# Patient Record
Sex: Male | Born: 2000 | Race: White | Hispanic: No | Marital: Single | Smoking: Never smoker
Health system: Southern US, Community
[De-identification: ages and names within clinical notes are randomized; demographics above are authoritative.]

---
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
## 2021-05-18 ENCOUNTER — Telehealth: Payer: Self-pay | Admitting: Allergy

## 2021-05-18 MED ORDER — EPINEPHRINE 0.3 MG/0.3ML IJ SOAJ: 0.3000 mg | INTRAMUSCULAR | 1 refills | Status: DC | PRN

## 2021-05-18 NOTE — Telephone Encounter (Signed)
Sent in 1 refill pt was seen in sept 2022

## 2021-05-18 NOTE — Telephone Encounter (Signed)
Patient's mom called requesting a refill for AuviQ. [facility] is getting ready to expire. Last seen 11/2020 has a follow up on 12/07/2021.

## 2021-08-28 ENCOUNTER — Telehealth: Payer: Self-pay | Admitting: Allergy

## 2021-08-28 NOTE — Telephone Encounter (Signed)
Mom Judeth Cornfield) called office requesting a letter from Dr Selena Batten.  Family is going to traveling to [place]. She is requesting a letter/documentation for patient to be able to bring his Auvi-Q.  Patient lives in [place] so they would like to pick up at that office. They are aware that Dr Selena Batten is out of the office this week. They can wait until next week. The trip is on 09/11/2021.

## 2021-09-04 NOTE — Telephone Encounter (Signed)
Please call mom and let her know that the letter will be at the front desk in [place] on 6/13 ready to be picked up.  Thank you.

## 2021-09-05 NOTE — Telephone Encounter (Signed)
I called the patient and left a detailed message that the letter is ready for pickup. I told them to call the [facility] office when they are on the way.

## 2021-12-06 NOTE — Progress Notes (Deleted)
Follow Up Note  RE: Ronnie Hubbard MRN: [id number] DOB: 16-Oct-2000 Date of Office Visit: 12/07/2021  Referring provider: Carol Ada, MD Primary care provider: Carol Ada, MD  Chief Complaint: No chief complaint on file.  History of Present Illness: I had the pleasure of seeing Ronnie Hubbard for a [facility]  on 12/06/2021. He is a 21 y.o. 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 His previous allergy office visit was on 12/06/2020 with Dr. Selena Batten. Today is a regular follow up visit.  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 Symptoms resolved with benadryl.  Tolerates peanuts, almonds, walnut, pecan and hazelnut with no issues.  2019 skin testing was positive to cashews only. Today's skin testing showed: Positive to cashews and pistachios. 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 Be careful with cross-contamination.  For mild symptoms you can take over the counter antihistamines such as Benadryl and monitor symptoms closely. If symptoms worsen or if you have severe symptoms including breathing issues, throat closure, significant swelling, whole body hives, severe diarrhea and vomiting, lightheadedness then inject epinephrine and seek immediate medical care afterwards. Emergency action plan given. Will re-check in 2-3 years.   Seasonal and perennial allergic rhinitis Rhinoconjunctivitis symptoms in the spring and summer.  Takes over-the-counter Allegra with good benefit.  2019 skin testing was positive to trees, mold and dog. Continue environmental control measures.  Use over the counter antihistamines such as Zyrtec (cetirizine), Claritin (loratadine), Allegra (fexofenadine), or Xyzal (levocetirizine) daily as needed. May take twice a day during allergy flares. May switch  antihistamines every few months. Declines nasal sprays and eye drops.    Return in about 1 year (around 12/06/2021).    Assessment and Plan: [name] is a 21 y.o. male with: No problem-specific Assessment & Plan notes found for this encounter.  No follow-ups on file.  No orders of the defined types were placed in this encounter.  Lab Orders  No laboratory test(s) ordered today    Diagnostics: Spirometry:  Tracings reviewed. His effort: {Blank single:19197::"Good reproducible efforts.","It was hard to get consistent efforts and there is a question as to whether this reflects a maximal maneuver.","Poor effort, data can not be interpreted."} FVC: ***L FEV1: ***L, ***% predicted FEV1/FVC ratio: ***% Interpretation: {Blank single:19197::"Spirometry consistent with mild obstructive disease","Spirometry consistent with moderate obstructive disease","Spirometry consistent with severe obstructive disease","Spirometry consistent with possible restrictive disease","Spirometry consistent with mixed obstructive and restrictive disease","Spirometry uninterpretable due to technique","Spirometry consistent with normal pattern","No overt abnormalities noted given today's efforts"}.  Please see scanned spirometry results for details.  Skin Testing: {Blank single:19197::"Select foods","Environmental allergy panel","Environmental allergy panel and select foods","Food allergy panel","None","Deferred due to recent antihistamines use"}. *** Results discussed with patient/family.   Medication List:  Current Outpatient Medications  Medication Sig Dispense Refill   EPINEPHrine (AUVI-Q) 0.3 mg/0.3 mL IJ SOAJ injection Inject 0.3 mg into the muscle as needed for anaphylaxis. 1 each 1   EPINEPHrine (EPIPEN 2-PAK) 0.3 mg/0.3 mL IJ SOAJ injection Inject 0.3 mg into the muscle as needed for anaphylaxis.     fexofenadine (ALLEGRA) 180 MG tablet Take 180 mg by mouth daily.     No current facility-administered  medications for this visit.   Allergies: Allergies  Allergen Reactions   Other Anaphylaxis    Cashew and pistachio allergy   I reviewed his  past medical history, social history, family history, and environmental history and no significant changes have been reported from his previous visit.  Review of Systems  Constitutional:  Negative for appetite change, chills, fever and unexpected weight change.  HENT:  Negative for congestion and rhinorrhea.   Eyes:  Negative for itching.  Respiratory:  Negative for cough, chest tightness, shortness of breath and wheezing.   Cardiovascular:  Negative for chest pain.  Gastrointestinal:  Negative for abdominal pain.  Genitourinary:  Negative for difficulty urinating.  Skin:  Negative for rash.  Allergic/Immunologic: Positive for environmental allergies and food allergies.  Neurological:  Negative for headaches.    Objective: There were no vitals taken for this visit. There is no height or weight on file to calculate BMI. Physical Exam Vitals and nursing note reviewed.  Constitutional:      Appearance: Normal appearance. He is well-developed.  HENT:     Head: Normocephalic and atraumatic.     Right Ear: Tympanic membrane and external ear normal.     Left Ear: Tympanic membrane and external ear normal.     Nose: Nose normal.     Mouth/Throat:     Mouth: Mucous membranes are moist.     Pharynx: Oropharynx is clear.  Eyes:     Conjunctiva/sclera: Conjunctivae normal.  Cardiovascular:     Rate and Rhythm: Normal rate and regular rhythm.     Heart sounds: Normal heart sounds. No murmur heard.    No friction rub. No gallop.  Pulmonary:     Effort: Pulmonary effort is normal.     Breath sounds: Normal breath sounds. No wheezing, rhonchi or rales.  Musculoskeletal:     Cervical back: Neck supple.  Skin:    General: Skin is warm.     Findings: No rash.  Neurological:     Mental Status: He is alert and oriented to person, place, and time.   Psychiatric:        Behavior: Behavior normal.    Previous notes and tests were reviewed. The plan was reviewed with the patient/family, and all questions/concerned were addressed.  It was my pleasure to see Ronnie Hubbard today and participate in his care. Please feel free to contact me with any questions or concerns.  Sincerely,  Wyline Mood, DO Allergy & Immunology  [facility] office: [phone] [facility] office: [phone]

## 2021-12-07 ENCOUNTER — Ambulatory Visit: Payer: Managed Care, Other (non HMO) | Admitting: Allergy

## 2021-12-13 NOTE — Progress Notes (Unsigned)
Follow Up Note  RE: Ronnie Hubbard MRN: [id number] DOB: 11-08-2000 Date of Office Visit: 12/14/2021  Referring provider: Budd Palmer, MD Primary care provider: Budd Palmer, MD  Chief Complaint: No chief complaint on file.  History of Present Illness: I had the pleasure of seeing Ronnie Hubbard for a follow up visit at the [facility] [place] on 12/13/2021. He is a 21 y.o. 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 His previous allergy office visit was on 12/06/2020 with Dr. Maudie Mercury. Today is a regular follow up visit.  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 Symptoms resolved with benadryl.  Tolerates peanuts, almonds, walnut, pecan and hazelnut with no issues.  2019 skin testing was positive to cashews only. Today's skin testing showed: Positive to cashews and pistachios. 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 Be careful with cross-contamination.  For mild symptoms you can take over the counter antihistamines such as Benadryl and monitor symptoms closely. If symptoms worsen or if you have severe symptoms including breathing issues, throat closure, significant swelling, whole body hives, severe diarrhea and vomiting, lightheadedness then inject epinephrine and seek immediate medical care afterwards. Emergency action plan given. Will re-check in 2-3 years.   Seasonal and perennial allergic rhinitis Rhinoconjunctivitis symptoms in the spring and summer.  Takes over-the-counter Allegra with good benefit.  2019 skin testing was positive to trees, mold and dog. Continue environmental control measures.  Use over the counter antihistamines such as Zyrtec (cetirizine), Claritin (loratadine), Allegra (fexofenadine), or Xyzal (levocetirizine) daily as needed. May take twice a day during allergy flares. May switch  antihistamines every few months. Declines nasal sprays and eye drops.    Return in about 1 year (around 12/06/2021).  Assessment and Plan: Ronnie Hubbard is a 21 y.o. male with: No problem-specific Assessment & Plan notes found for this encounter.  No follow-ups on file.  No orders of the defined types were placed in this encounter.  Lab Orders  No laboratory test(s) ordered today    Diagnostics: Spirometry:  Tracings reviewed. His effort: {Blank single:19197::"Good reproducible efforts.","It was hard to get consistent efforts and there is a question as to whether this reflects a maximal maneuver.","Poor effort, data can not be interpreted."} FVC: ***L FEV1: ***L, ***% predicted FEV1/FVC ratio: ***% Interpretation: {Blank single:19197::"Spirometry consistent with mild obstructive disease","Spirometry consistent with moderate obstructive disease","Spirometry consistent with severe obstructive disease","Spirometry consistent with possible restrictive disease","Spirometry consistent with mixed obstructive and restrictive disease","Spirometry uninterpretable due to technique","Spirometry consistent with normal pattern","No overt abnormalities noted given today's efforts"}.  Please see scanned spirometry results for details.  Skin Testing: {Blank single:19197::"Select foods","Environmental allergy panel","Environmental allergy panel and select foods","Food allergy panel","None","Deferred due to recent antihistamines use"}. *** Results discussed with patient/family.   Medication List:  Current Outpatient Medications  Medication Sig Dispense Refill   EPINEPHrine (AUVI-Q) 0.3 mg/0.3 mL IJ SOAJ injection Inject 0.3 mg into the muscle as needed for anaphylaxis. 1 each 1   EPINEPHrine (EPIPEN 2-PAK) 0.3 mg/0.3 mL IJ SOAJ injection Inject 0.3 mg into the muscle as needed for anaphylaxis.     fexofenadine (ALLEGRA) 180 MG tablet Take 180 mg by mouth daily.     No current facility-administered  medications for this visit.   Allergies: Allergies  Allergen Reactions   Other Anaphylaxis    Cashew and pistachio allergy   I reviewed his past medical  history, social history, family history, and environmental history and no significant changes have been reported from his previous visit.  Review of Systems  Constitutional:  Negative for appetite change, chills, fever and unexpected weight change.  HENT:  Negative for congestion and rhinorrhea.   Eyes:  Negative for itching.  Respiratory:  Negative for cough, chest tightness, shortness of breath and wheezing.   Cardiovascular:  Negative for chest pain.  Gastrointestinal:  Negative for abdominal pain.  Genitourinary:  Negative for difficulty urinating.  Skin:  Negative for rash.  Allergic/Immunologic: Positive for environmental allergies and food allergies.  Neurological:  Negative for headaches.    Objective: There were no vitals taken for this visit. There is no height or weight on file to calculate BMI. Physical Exam Vitals and nursing note reviewed.  Constitutional:      Appearance: Normal appearance. He is well-developed.  HENT:     Head: Normocephalic and atraumatic.     Right Ear: Tympanic membrane and external ear normal.     Left Ear: Tympanic membrane and external ear normal.     Nose: Nose normal.     Mouth/Throat:     Mouth: Mucous membranes are moist.     Pharynx: Oropharynx is clear.  Eyes:     Conjunctiva/sclera: Conjunctivae normal.  Cardiovascular:     Rate and Rhythm: Normal rate and regular rhythm.     Heart sounds: Normal heart sounds. No murmur heard.    No friction rub. No gallop.  Pulmonary:     Effort: Pulmonary effort is normal.     Breath sounds: Normal breath sounds. No wheezing, rhonchi or rales.  Musculoskeletal:     Cervical back: Neck supple.  Skin:    General: Skin is warm.     Findings: No rash.  Neurological:     Mental Status: He is alert and oriented to person, place, and time.   Psychiatric:        Behavior: Behavior normal.    Previous notes and tests were reviewed. The plan was reviewed with the patient/family, and all questions/concerned were addressed.  It was my pleasure to see Ronnie Hubbard today and participate in his care. Please feel free to contact me with any questions or concerns.  Sincerely,  Rexene Alberts, DO Allergy & Immunology  [facility] office: [place] office: [phone]

## 2021-12-14 ENCOUNTER — Encounter: Payer: Self-pay | Admitting: Allergy

## 2021-12-14 ENCOUNTER — Ambulatory Visit: Payer: Managed Care, Other (non HMO) | Admitting: Allergy

## 2021-12-14 VITALS — BP 102/62 | HR 73 | Temp 98.7°F | Resp 16 | Ht 71.0 in | Wt 149.0 lb

## 2021-12-14 DIAGNOSIS — J3089 Other allergic rhinitis: Secondary | ICD-10-CM

## 2021-12-14 DIAGNOSIS — T7800XD Anaphylactic reaction due to unspecified food, subsequent encounter: Secondary | ICD-10-CM | POA: Diagnosis not present

## 2021-12-14 DIAGNOSIS — J302 Other seasonal allergic rhinitis: Secondary | ICD-10-CM

## 2021-12-14 MED ORDER — EPINEPHRINE 0.3 MG/0.3ML IJ SOAJ: 0.3000 mg | INTRAMUSCULAR | 1 refills | Status: AC | PRN

## 2021-12-14 NOTE — Assessment & Plan Note (Signed)
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 Symptoms resolved with benadryl.  Tolerates peanuts, almonds, walnut, pecan and hazelnut with no issues.  2019 skin testing was positive to cashews only. 2022 skin testing showed: Positive to cashews and pistachios. Interim history - no reactions.   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  Be careful with cross-contamination.   For mild symptoms you can take over the counter antihistamines such as Benadryl and monitor symptoms closely. If symptoms worsen or if you have severe symptoms including breathing issues, throat closure, significant swelling, whole body hives, severe diarrhea and vomiting, lightheadedness then inject epinephrine and seek immediate medical care afterwards.  If Auvi-Q is not covered let us know.   Emergency action plan in place.   Will re-check in 1-2 years.

## 2021-12-14 NOTE — Assessment & Plan Note (Signed)
Past history - Rhinoconjunctivitis symptoms in the spring and summer.  Takes over-the-counter Allegra with good benefit.  2019 skin testing was positive to trees, mold and dog. Interim history - sometimes symptoms flare in the spring/summer and otc meds effective.  . Continue environmental control measures.  . Use over the counter antihistamines such as Zyrtec (cetirizine), Claritin (loratadine), Allegra (fexofenadine), or Xyzal (levocetirizine) daily as needed. May take twice a day during allergy flares. May switch antihistamines every few months. . If symptoms not controlled then let us know - will send in nasal spray/eye drops then. o Consider re-testing in the future.

## 2021-12-14 NOTE — Patient Instructions (Addendum)
Food allergies 2022 skin testing showed: Positive to cashews and pistachios. 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 Be careful with cross-contamination.  For mild symptoms you can take over the counter antihistamines such as Benadryl and monitor symptoms closely. If symptoms worsen or if you have severe symptoms including breathing issues, throat closure, significant swelling, whole body hives, severe diarrhea and vomiting, lightheadedness then inject epinephrine and seek immediate medical care afterwards. If Auvi-Q is not covered let us know.  Emergency action plan in place.  Will re-check in 1-2 years.  Environmental allergies 2019 allergy testing positive to trees, mold, and dog. Continue environmental control measures.  Use over the counter antihistamines such as Zyrtec (cetirizine), Claritin (loratadine), Allegra (fexofenadine), or Xyzal (levocetirizine) daily as needed. May take twice a day during allergy flares. May switch antihistamines every few months. If symptoms not controlled then let us know - there are nasal sprays and eye drops that I can prescribe if needed.   Follow up in 12 months or sooner if needed.    Reducing Pollen Exposure Pollen seasons: trees (spring), grass (summer) and ragweed/weeds (fall). Keep windows closed in your home and car to lower pollen exposure.  Install air conditioning in the bedroom and throughout the house if possible.  Avoid going out in dry windy days - especially early morning. Pollen counts are highest between 5 - 10 AM and on dry, hot and windy days.  Save outside activities for late afternoon or after a heavy rain, when pollen levels are lower.  Avoid mowing of grass if you have grass pollen allergy. Be aware that pollen can also be transported indoors on people and pets.  Dry your clothes in an automatic dryer rather than hanging them outside where they might collect pollen.  Rinse hair and eyes before bedtime. Mold  Control Mold and fungi can grow on a variety of surfaces provided certain temperature and moisture conditions exist.  Outdoor molds grow on plants, decaying vegetation and soil. The major outdoor mold, Alternaria and Cladosporium, are found in very high numbers during hot and dry conditions. Generally, a late summer - fall peak is seen for common outdoor fungal spores. Rain will temporarily lower outdoor mold spore count, but counts rise rapidly when the rainy period ends. The most important indoor molds are Aspergillus and Penicillium. Dark, humid and poorly ventilated basements are ideal sites for mold growth. The next most common sites of mold growth are the bathroom and the kitchen. Outdoor (Seasonal) Mold Control Use air conditioning and keep windows closed. Avoid exposure to decaying vegetation. Avoid leaf raking. Avoid grain handling. Consider wearing a face mask if working in moldy areas.  Indoor (Perennial) Mold Control  Maintain humidity below 50%. Get rid of mold growth on hard surfaces with water, detergent and, if necessary, 5% bleach (do not mix with other cleaners). Then dry the area completely. If mold covers an area more than 10 square feet, consider hiring an indoor environmental professional. For clothing, washing with soap and water is best. If moldy items cannot be cleaned and dried, throw them away. Remove sources e.g. contaminated carpets. Repair and seal leaking roofs or pipes. Using dehumidifiers in damp basements may be helpful, but empty the water and clean units regularly to prevent mildew from forming. All rooms, especially basements, bathrooms and kitchens, require ventilation and cleaning to deter mold and mildew growth. Avoid carpeting on concrete or damp floors, and storing items in damp areas. Pet Allergen Avoidance: Contrary to popular opinion, there  are no "hypoallergenic" breeds of dogs or cats. That is because people are not allergic to an animal's hair, but to  an allergen found in the animal's saliva, dander (dead skin flakes) or urine. Pet allergy symptoms typically occur within minutes. For some people, symptoms can build up and become most severe 8 to 12 hours after contact with the animal. People with severe allergies can experience reactions in public places if dander has been transported on the pet owners' clothing. Keeping an animal outdoors is only a partial solution, since homes with pets in the yard still have higher concentrations of animal allergens. Before getting a pet, ask your allergist to determine if you are allergic to animals. If your pet is already considered part of your family, try to minimize contact and keep the pet out of the bedroom and other rooms where you spend a great deal of time. As with dust mites, vacuum carpets often or replace carpet with a hardwood floor, tile or linoleum. High-efficiency particulate air (HEPA) cleaners can reduce allergen levels over time. While dander and saliva are the source of cat and dog allergens, urine is the source of allergens from rabbits, hamsters, mice and [place] pigs; so ask a non-allergic family member to clean the animal's cage. If you have a pet allergy, talk to your allergist about the potential for allergy immunotherapy (allergy shots). This strategy can often provide long-term relief.

## 2023-02-05 ENCOUNTER — Other Ambulatory Visit: Payer: Self-pay

## 2023-02-12 ENCOUNTER — Other Ambulatory Visit: Payer: Self-pay

## 2023-02-15 ENCOUNTER — Other Ambulatory Visit: Payer: Self-pay | Admitting: Allergy
